# Patient Record
Sex: Female | Born: 1950
Health system: Southern US, Community
[De-identification: ages and names within clinical notes are randomized; demographics above are authoritative.]

---

## 1998-10-12 ENCOUNTER — Ambulatory Visit (HOSPITAL_COMMUNITY): Admission: RE | Admit: 1998-10-12 | Discharge: 1998-10-12 | Payer: Self-pay | Admitting: Obstetrics & Gynecology

## 1999-10-12 ENCOUNTER — Encounter: Payer: Self-pay | Admitting: Obstetrics & Gynecology

## 1999-10-12 ENCOUNTER — Ambulatory Visit (HOSPITAL_COMMUNITY): Admission: RE | Admit: 1999-10-12 | Discharge: 1999-10-12 | Payer: Self-pay | Admitting: Obstetrics & Gynecology

## 2000-10-14 ENCOUNTER — Ambulatory Visit (HOSPITAL_COMMUNITY): Admission: RE | Admit: 2000-10-14 | Discharge: 2000-10-14 | Payer: Self-pay | Admitting: Obstetrics & Gynecology

## 2000-10-14 ENCOUNTER — Encounter: Payer: Self-pay | Admitting: Obstetrics & Gynecology

## 2000-10-18 ENCOUNTER — Encounter: Payer: Self-pay | Admitting: Obstetrics & Gynecology

## 2000-10-18 ENCOUNTER — Encounter: Admission: RE | Admit: 2000-10-18 | Discharge: 2000-10-18 | Payer: Self-pay | Admitting: Obstetrics & Gynecology

## 2000-10-30 ENCOUNTER — Other Ambulatory Visit: Admission: RE | Admit: 2000-10-30 | Discharge: 2000-10-30 | Payer: Self-pay | Admitting: Obstetrics & Gynecology

## 2003-05-10 ENCOUNTER — Emergency Department (HOSPITAL_COMMUNITY): Admission: EM | Admit: 2003-05-10 | Discharge: 2003-05-10 | Payer: Self-pay | Admitting: Emergency Medicine

## 2003-06-23 ENCOUNTER — Other Ambulatory Visit: Admission: RE | Admit: 2003-06-23 | Discharge: 2003-06-23 | Payer: Self-pay | Admitting: Obstetrics & Gynecology

## 2004-06-26 ENCOUNTER — Other Ambulatory Visit: Admission: RE | Admit: 2004-06-26 | Discharge: 2004-06-26 | Payer: Self-pay | Admitting: Obstetrics & Gynecology

## 2005-07-20 ENCOUNTER — Other Ambulatory Visit: Admission: RE | Admit: 2005-07-20 | Discharge: 2005-07-20 | Payer: Self-pay | Admitting: Obstetrics and Gynecology

## 2010-09-25 ENCOUNTER — Ambulatory Visit: Payer: Self-pay | Admitting: Emergency Medicine

## 2010-09-25 DIAGNOSIS — M25569 Pain in unspecified knee: Secondary | ICD-10-CM | POA: Insufficient documentation

## 2010-10-02 ENCOUNTER — Telehealth (INDEPENDENT_AMBULATORY_CARE_PROVIDER_SITE_OTHER): Payer: Self-pay | Admitting: *Deleted

## 2011-01-21 ENCOUNTER — Encounter: Payer: Self-pay | Admitting: Obstetrics & Gynecology

## 2011-02-01 NOTE — Assessment & Plan Note (Signed)
Summary: KNEE PAIN /NH   Vital Signs:  Patient Profile:   60 Years Old Female CC:      left knee pain post fall last night Height:     63 inches Weight:      166 pounds O2 Sat:      97 % O2 treatment:    Room Air Temp:     98.8 degrees F oral Pulse rate:   90 / minute Resp:     14 per minute BP sitting:   131 / 86  (left arm) Cuff size:   regular  Pt. in pain?   yes    Location:   left knee    Intensity:   8    Type:       dull  Vitals Entered By: Lajean Saver RN (September 25, 2010 4:01 PM)                   Updated Prior Medication List: No Medications Current Allergies: ! SULFAHistory of Present Illness Chief Complaint: left knee pain post fall last night History of Present Illness: L knee pain s/p a fall last night.  Tripped over her own shoes when taking the dog out.  Larey Seat quickly, so doesn't recall exactly what happened, but thinks she landed on anterior knee.  Pain was worse last night and this morning and has since been getting better.  Sharp pain worse w/ movement located medial L knee.  Mild swelling. No prev knee trauma or injuries. Not taking any OTC meds.  REVIEW OF SYSTEMS Constitutional Symptoms      Denies fever, chills, night sweats, weight loss, weight gain, and fatigue.  Eyes       Denies change in vision, eye pain, eye discharge, glasses, contact lenses, and eye surgery. Ear/Nose/Throat/Mouth       Denies hearing loss/aids, change in hearing, ear pain, ear discharge, dizziness, frequent runny nose, frequent nose bleeds, sinus problems, sore throat, hoarseness, and tooth pain or bleeding.  Respiratory       Denies dry cough, productive cough, wheezing, shortness of breath, asthma, bronchitis, and emphysema/COPD.  Cardiovascular       Denies murmurs, chest pain, and tires easily with exhertion.    Gastrointestinal       Denies stomach pain, nausea/vomiting, diarrhea, constipation, blood in bowel movements, and indigestion. Genitourniary  Denies painful urination, blood or discharge from vagina, kidney stones, and loss of urinary control. Neurological       Denies paralysis, seizures, and fainting/blackouts. Musculoskeletal       Complains of joint pain and swelling.      Denies muscle pain, joint stiffness, decreased range of motion, redness, muscle weakness, and gout.      Comments: left knee Skin       Denies bruising, unusual mles/lumps or sores, and hair/skin or nail changes.  Psych       Denies mood changes, temper/anger issues, anxiety/stress, speech problems, depression, and sleep problems. Other Comments: Patient was letting her dog out last night and tripped over a shoe landing on her left knee.    Past History:  Past Medical History: Unremarkable  Past Surgical History: Hysterectomy 1988 Cholecystectomy 50  Family History: MI- father and mother  Social History: Never Smoked Alcohol use-yes 4/week Drug use-no Smoking Status:  never Drug Use:  no Physical Exam General appearance: well developed, well nourished, no acute distress Head: normocephalic, atraumatic Extremities: see below Neurological: grossly intact and non-focal Skin: no obvious rashes or lesions MSE:  oriented to time, place, and person Left knee: FROM, no effusion, no ecchymoses, Lachmans normal, Anterior & posterior drawer normal, McMurrays normal, valgus stress is painful and tender along the course of the MCL, medial joint line only slightly painful.  Patella freely mobile, Clarks compression test normal.  Good alignment.  Assessment New Problems: KNEE PAIN, LEFT (ICD-719.46)  Left MCL sprain Xray is normal  Plan New Medications/Changes: MELOXICAM 7.5 MG TABS (MELOXICAM) 1 tab by mouth two times a day as needed for pain  #40 x 0, 09/25/2010, Hoyt Koch MD  New Orders: New Patient Level III [phone] T-DG Knee Complete 4 Views*L* [27253] Planning Comments:   Ice, avoid further trauma, rest Take meds as directed You  should notice that you're getting better slowly over the next week.  If not, then follow up with your PCP or call sports medicine   The patient and/or caregiver has been counseled thoroughly with regard to medications prescribed including dosage, schedule, interactions, rationale for use, and possible side effects and they verbalize understanding.  Diagnoses and expected course of recovery discussed and will return if not improved as expected or if the condition worsens. Patient and/or caregiver verbalized understanding.  Prescriptions: MELOXICAM 7.5 MG TABS (MELOXICAM) 1 tab by mouth two times a day as needed for pain  #40 x 0   Entered and Authorized by:   Hoyt Koch MD   Signed by:   Hoyt Koch MD on 09/25/2010   Method used:   Print then Give to Patient   RxID:   [id number]   Orders Added: 1)  New Patient Level III [75643] 2)  T-DG Knee Complete 4 Views*L* [32951]

## 2011-02-01 NOTE — Progress Notes (Signed)
  Phone Note Outgoing Call Call back at [facility] Phone [phone]   Call placed by: Lajean Saver RN,  October 02, 2010 3:04 PM Call placed to: Patient Summary of Call: Call back: No answe.Message left wiht reason for call and call back with any questions

## 2011-09-04 IMAGING — CR DG KNEE COMPLETE 4+V*L*
4 series · 4 of 4 positions shown · non-contrast
Comparison: None.

CLINICAL DATA: Medial knee pain following fall next field

LEFT KNEE - COMPLETE 4+ VIEW

[view not recorded (1 of 4)]
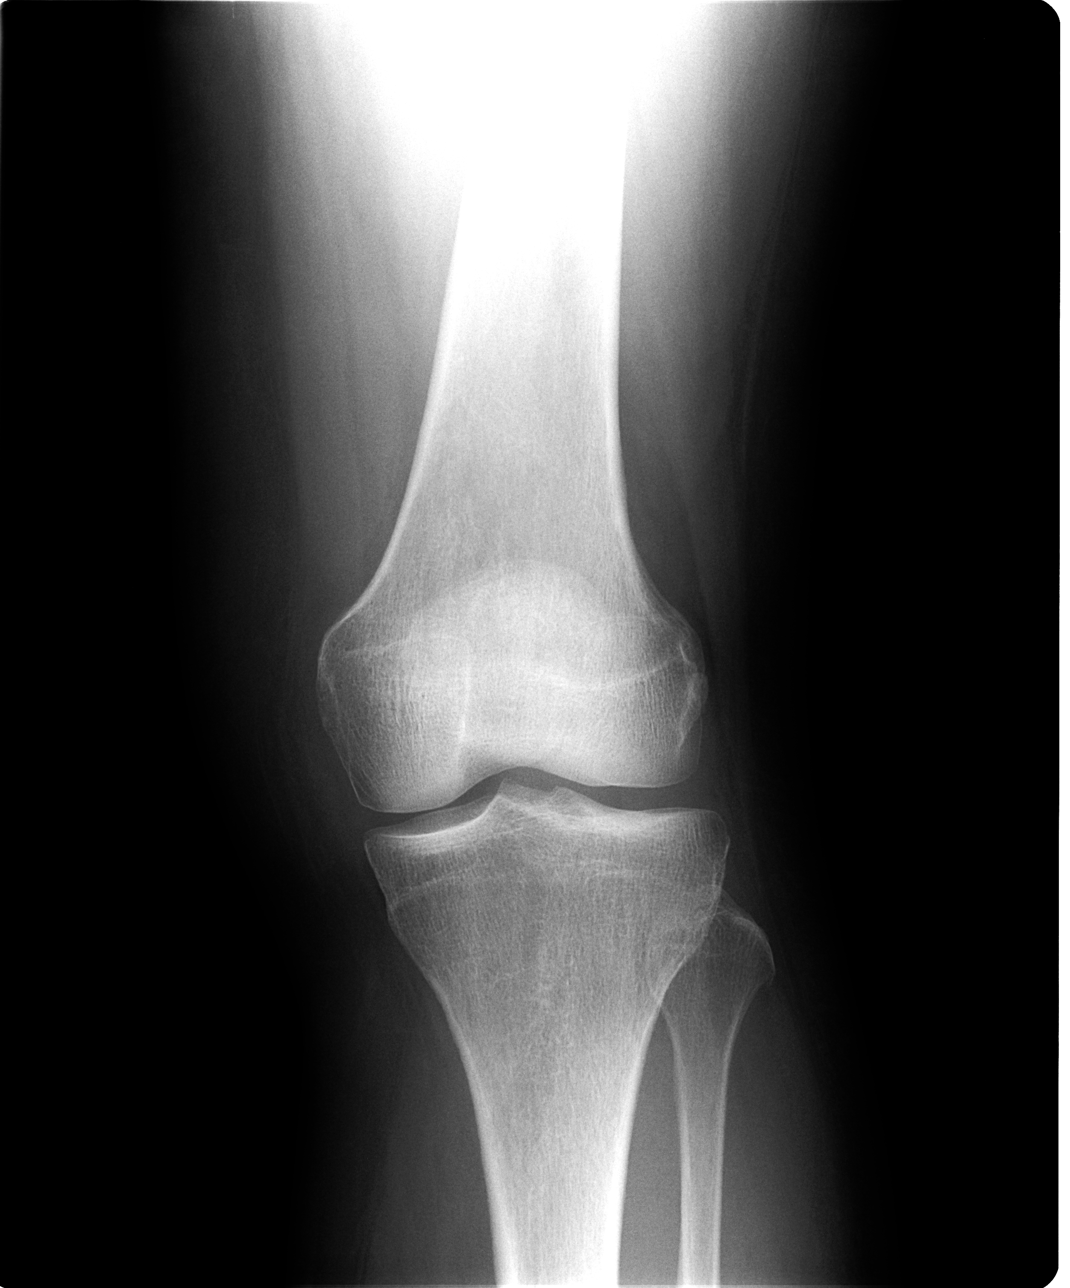

[view not recorded (2 of 4)]
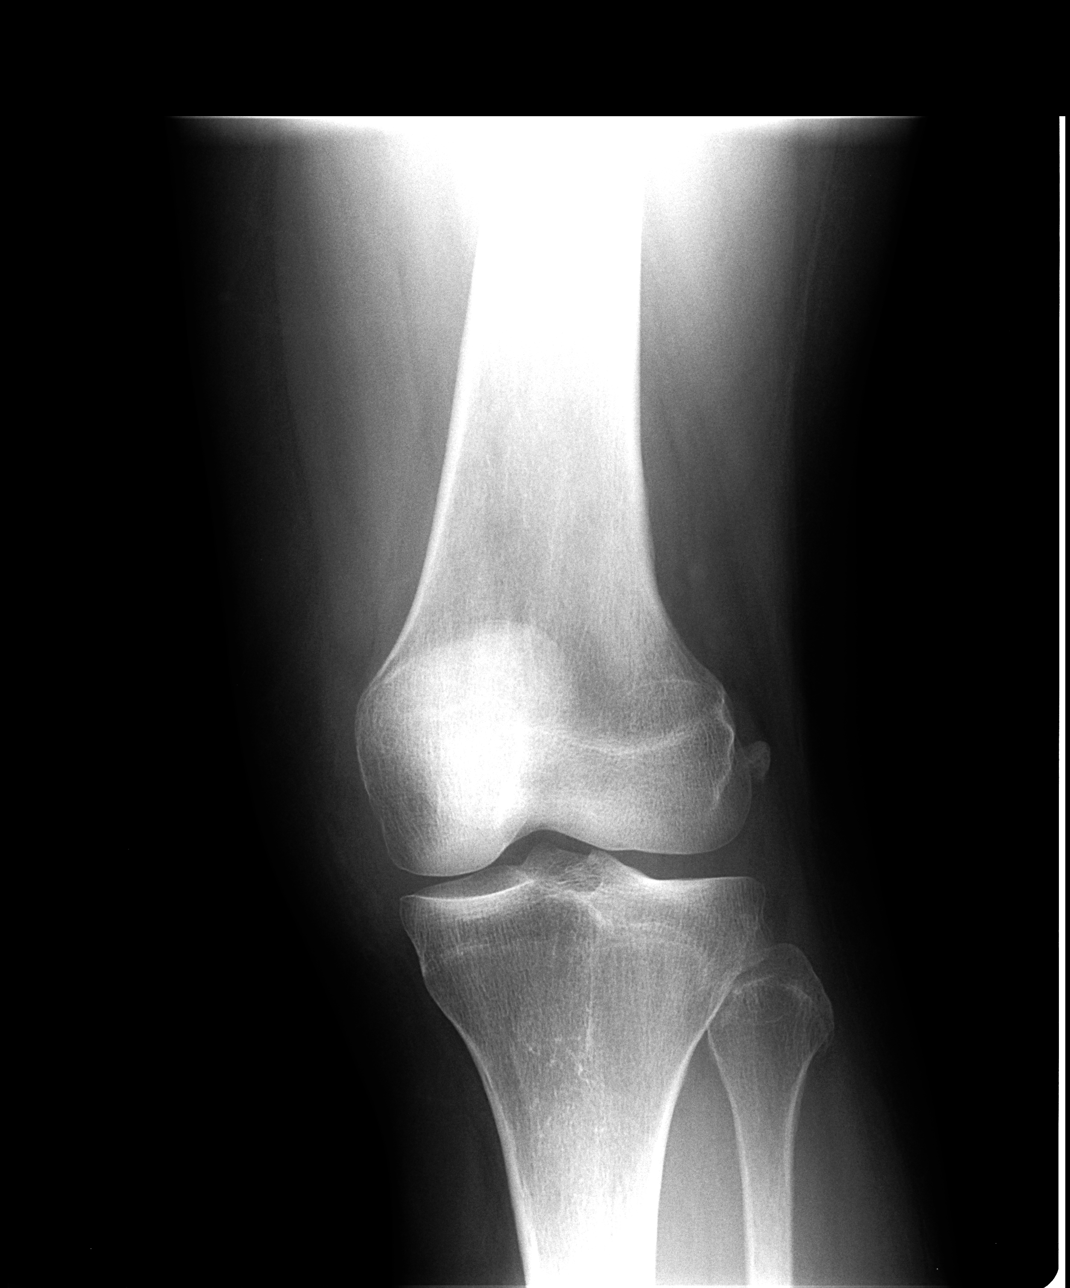

[view not recorded (3 of 4)]
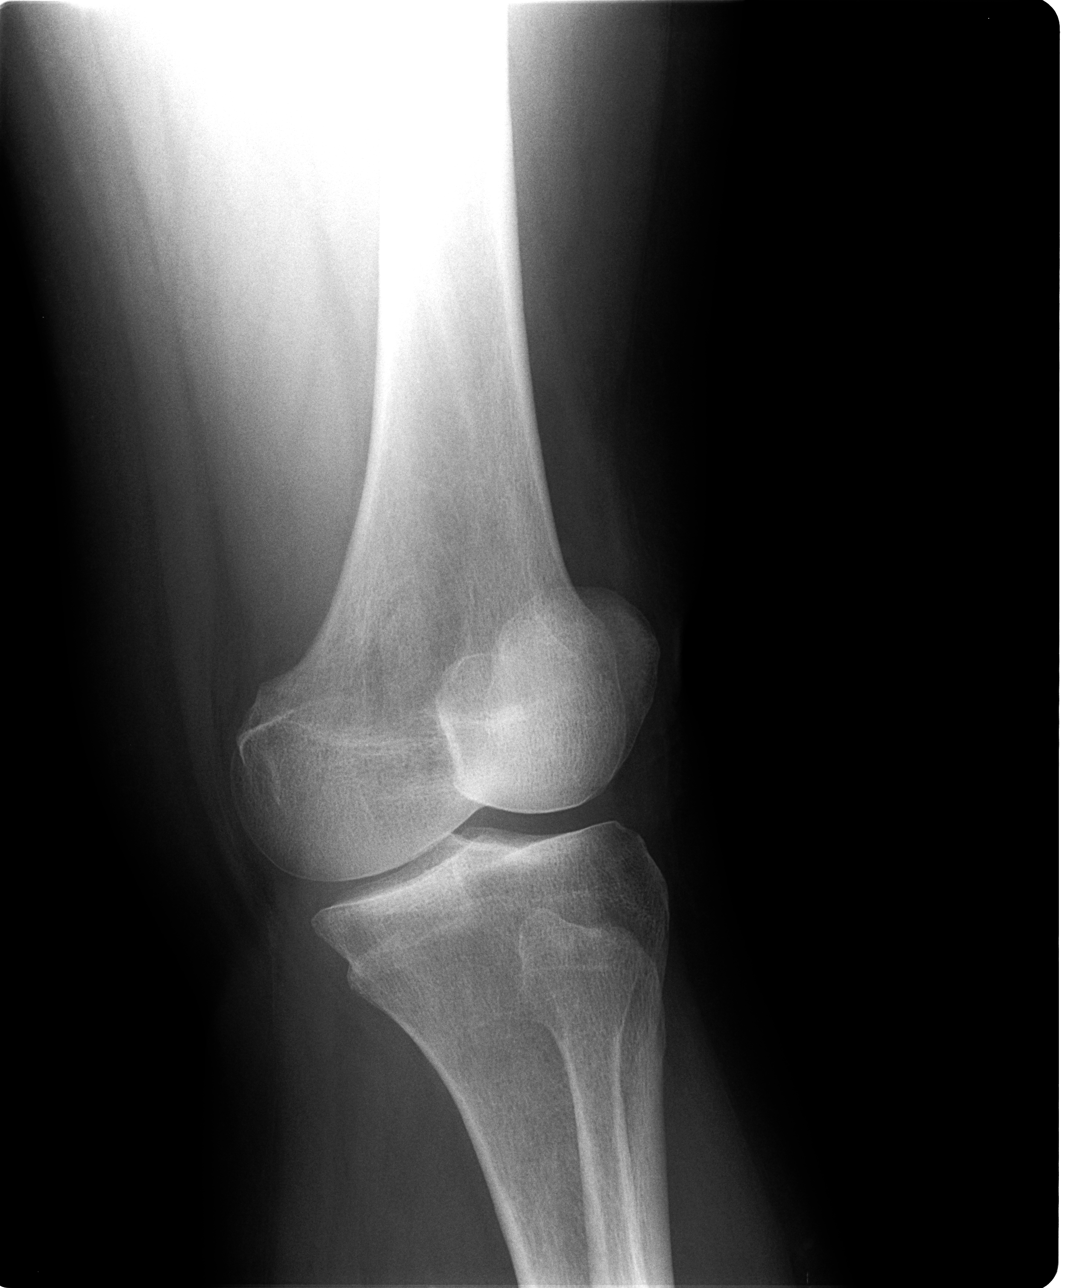

[view not recorded (4 of 4)]
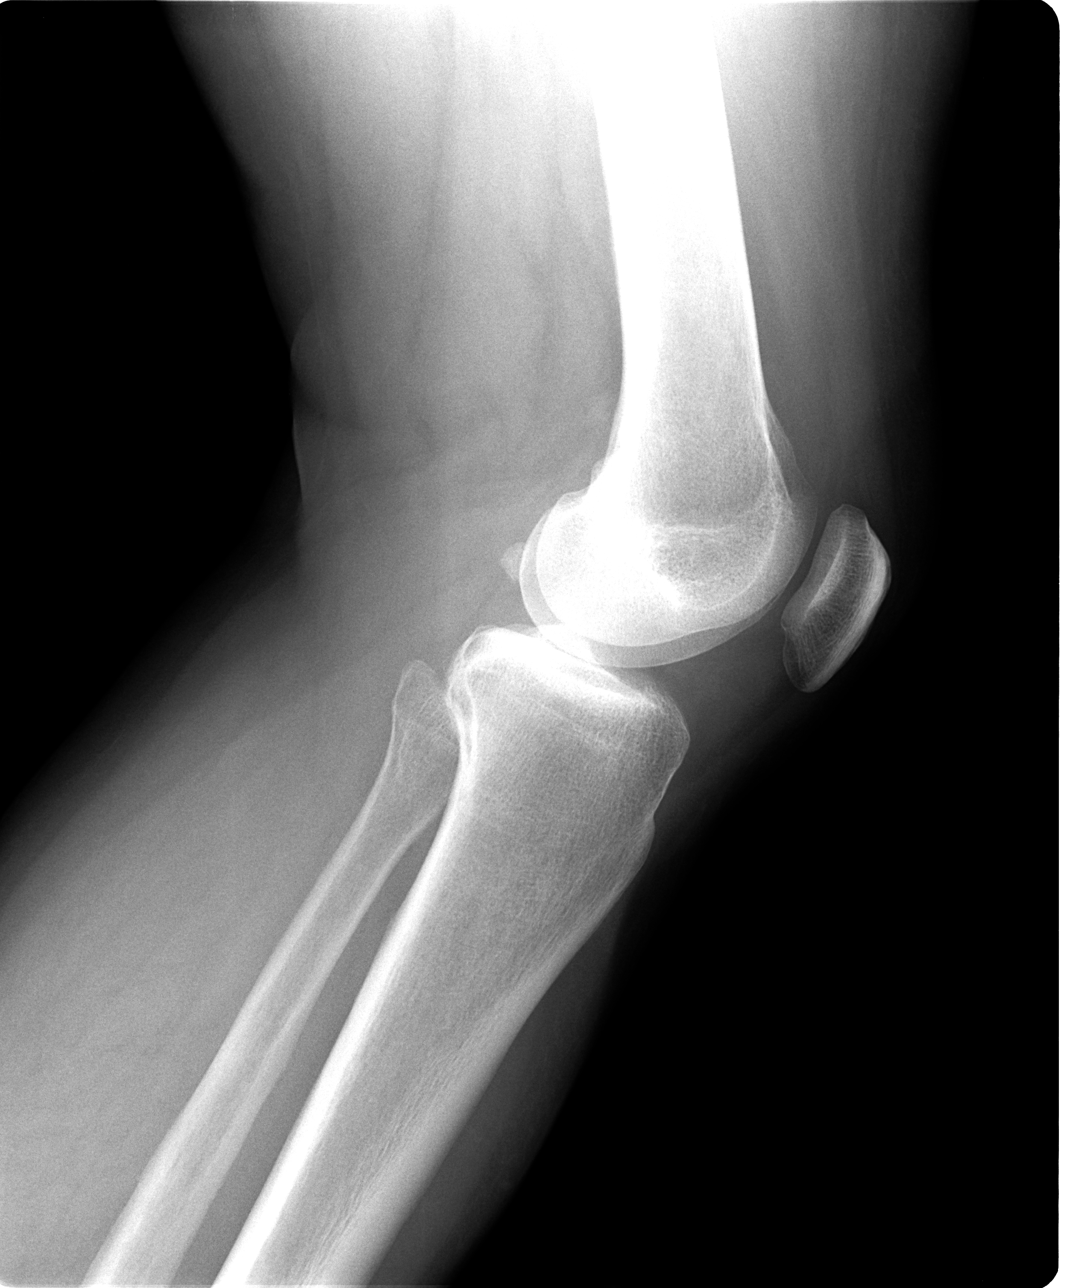

[4 of 4 positions shown; findings below may reference images not displayed]

FINDINGS: No fracture or dislocation.  No specific arthropathy. No
foreign body or other abnormality of the soft tissues.
IMPRESSION: No acute or significant findings.

## 2014-09-10 ENCOUNTER — Other Ambulatory Visit: Payer: Self-pay | Admitting: Obstetrics & Gynecology

## 2014-09-10 DIAGNOSIS — R928 Other abnormal and inconclusive findings on diagnostic imaging of breast: Secondary | ICD-10-CM

## 2014-09-22 ENCOUNTER — Inpatient Hospital Stay: Admission: RE | Admit: 2014-09-22 | Payer: Self-pay | Source: Ambulatory Visit

## 2017-09-06 DIAGNOSIS — M79641 Pain in right hand: Secondary | ICD-10-CM | POA: Diagnosis not present

## 2018-07-17 DIAGNOSIS — H60543 Acute eczematoid otitis externa, bilateral: Secondary | ICD-10-CM | POA: Diagnosis not present

## 2018-07-17 DIAGNOSIS — L309 Dermatitis, unspecified: Secondary | ICD-10-CM | POA: Diagnosis not present

## 2018-09-12 DIAGNOSIS — R030 Elevated blood-pressure reading, without diagnosis of hypertension: Secondary | ICD-10-CM | POA: Diagnosis not present

## 2018-09-12 DIAGNOSIS — Z6830 Body mass index (BMI) 30.0-30.9, adult: Secondary | ICD-10-CM | POA: Diagnosis not present

## 2018-09-12 DIAGNOSIS — Z823 Family history of stroke: Secondary | ICD-10-CM | POA: Diagnosis not present

## 2018-09-12 DIAGNOSIS — Z8249 Family history of ischemic heart disease and other diseases of the circulatory system: Secondary | ICD-10-CM | POA: Diagnosis not present

## 2018-09-12 DIAGNOSIS — E669 Obesity, unspecified: Secondary | ICD-10-CM | POA: Diagnosis not present

## 2018-09-12 DIAGNOSIS — I951 Orthostatic hypotension: Secondary | ICD-10-CM | POA: Diagnosis not present

## 2018-09-12 DIAGNOSIS — Z882 Allergy status to sulfonamides status: Secondary | ICD-10-CM | POA: Diagnosis not present

## 2018-09-12 DIAGNOSIS — Z853 Personal history of malignant neoplasm of breast: Secondary | ICD-10-CM | POA: Diagnosis not present

## 2018-10-28 DIAGNOSIS — Z1231 Encounter for screening mammogram for malignant neoplasm of breast: Secondary | ICD-10-CM | POA: Diagnosis not present

## 2018-10-29 DIAGNOSIS — H527 Unspecified disorder of refraction: Secondary | ICD-10-CM | POA: Diagnosis not present

## 2018-10-29 DIAGNOSIS — H2513 Age-related nuclear cataract, bilateral: Secondary | ICD-10-CM | POA: Diagnosis not present

## 2018-10-29 DIAGNOSIS — H53039 Strabismic amblyopia, unspecified eye: Secondary | ICD-10-CM | POA: Diagnosis not present

## 2018-11-12 DIAGNOSIS — H5015 Alternating exotropia: Secondary | ICD-10-CM | POA: Diagnosis not present

## 2019-06-30 DIAGNOSIS — S39011A Strain of muscle, fascia and tendon of abdomen, initial encounter: Secondary | ICD-10-CM | POA: Diagnosis not present

## 2019-09-30 DIAGNOSIS — Z20828 Contact with and (suspected) exposure to other viral communicable diseases: Secondary | ICD-10-CM | POA: Diagnosis not present

## 2019-09-30 DIAGNOSIS — J029 Acute pharyngitis, unspecified: Secondary | ICD-10-CM | POA: Diagnosis not present

## 2019-10-05 DIAGNOSIS — Z20828 Contact with and (suspected) exposure to other viral communicable diseases: Secondary | ICD-10-CM | POA: Diagnosis not present

## 2019-10-05 DIAGNOSIS — Z03818 Encounter for observation for suspected exposure to other biological agents ruled out: Secondary | ICD-10-CM | POA: Diagnosis not present

## 2019-12-29 DIAGNOSIS — D122 Benign neoplasm of ascending colon: Secondary | ICD-10-CM | POA: Diagnosis not present

## 2019-12-29 DIAGNOSIS — D120 Benign neoplasm of cecum: Secondary | ICD-10-CM | POA: Diagnosis not present

## 2019-12-29 DIAGNOSIS — K635 Polyp of colon: Secondary | ICD-10-CM | POA: Diagnosis not present

## 2019-12-29 DIAGNOSIS — D123 Benign neoplasm of transverse colon: Secondary | ICD-10-CM | POA: Diagnosis not present

## 2019-12-29 DIAGNOSIS — Z86010 Personal history of colonic polyps: Secondary | ICD-10-CM | POA: Diagnosis not present

## 2019-12-30 DIAGNOSIS — Z1231 Encounter for screening mammogram for malignant neoplasm of breast: Secondary | ICD-10-CM | POA: Diagnosis not present

## 2019-12-30 DIAGNOSIS — Z6830 Body mass index (BMI) 30.0-30.9, adult: Secondary | ICD-10-CM | POA: Diagnosis not present

## 2019-12-30 DIAGNOSIS — Z1272 Encounter for screening for malignant neoplasm of vagina: Secondary | ICD-10-CM | POA: Diagnosis not present

## 2019-12-30 DIAGNOSIS — Z124 Encounter for screening for malignant neoplasm of cervix: Secondary | ICD-10-CM | POA: Diagnosis not present

## 2019-12-30 DIAGNOSIS — Z90710 Acquired absence of both cervix and uterus: Secondary | ICD-10-CM | POA: Diagnosis not present

## 2020-05-12 DIAGNOSIS — Z809 Family history of malignant neoplasm, unspecified: Secondary | ICD-10-CM | POA: Diagnosis not present

## 2020-05-12 DIAGNOSIS — Z853 Personal history of malignant neoplasm of breast: Secondary | ICD-10-CM | POA: Diagnosis not present

## 2020-05-12 DIAGNOSIS — Z6831 Body mass index (BMI) 31.0-31.9, adult: Secondary | ICD-10-CM | POA: Diagnosis not present

## 2020-05-12 DIAGNOSIS — Z833 Family history of diabetes mellitus: Secondary | ICD-10-CM | POA: Diagnosis not present

## 2020-05-12 DIAGNOSIS — Z882 Allergy status to sulfonamides status: Secondary | ICD-10-CM | POA: Diagnosis not present

## 2020-05-12 DIAGNOSIS — R030 Elevated blood-pressure reading, without diagnosis of hypertension: Secondary | ICD-10-CM | POA: Diagnosis not present

## 2020-05-12 DIAGNOSIS — E669 Obesity, unspecified: Secondary | ICD-10-CM | POA: Diagnosis not present

## 2020-05-27 DIAGNOSIS — S93401A Sprain of unspecified ligament of right ankle, initial encounter: Secondary | ICD-10-CM | POA: Diagnosis not present

## 2020-05-27 DIAGNOSIS — S92352A Displaced fracture of fifth metatarsal bone, left foot, initial encounter for closed fracture: Secondary | ICD-10-CM | POA: Diagnosis not present

## 2020-05-28 DIAGNOSIS — M79671 Pain in right foot: Secondary | ICD-10-CM | POA: Diagnosis not present

## 2020-06-14 DIAGNOSIS — M79671 Pain in right foot: Secondary | ICD-10-CM | POA: Diagnosis not present

## 2020-07-05 DIAGNOSIS — M79671 Pain in right foot: Secondary | ICD-10-CM | POA: Diagnosis not present

## 2020-07-07 DIAGNOSIS — R609 Edema, unspecified: Secondary | ICD-10-CM | POA: Diagnosis not present

## 2020-07-07 DIAGNOSIS — R2241 Localized swelling, mass and lump, right lower limb: Secondary | ICD-10-CM | POA: Diagnosis not present

## 2020-07-13 DIAGNOSIS — M79671 Pain in right foot: Secondary | ICD-10-CM | POA: Diagnosis not present

## 2020-08-23 DIAGNOSIS — M79671 Pain in right foot: Secondary | ICD-10-CM | POA: Diagnosis not present

## 2021-01-12 DIAGNOSIS — H53039 Strabismic amblyopia, unspecified eye: Secondary | ICD-10-CM | POA: Diagnosis not present

## 2021-01-12 DIAGNOSIS — H2513 Age-related nuclear cataract, bilateral: Secondary | ICD-10-CM | POA: Diagnosis not present

## 2021-01-12 DIAGNOSIS — H5203 Hypermetropia, bilateral: Secondary | ICD-10-CM | POA: Diagnosis not present

## 2021-01-12 DIAGNOSIS — H40033 Anatomical narrow angle, bilateral: Secondary | ICD-10-CM | POA: Diagnosis not present

## 2021-01-26 DIAGNOSIS — Z780 Asymptomatic menopausal state: Secondary | ICD-10-CM | POA: Diagnosis not present

## 2021-01-26 DIAGNOSIS — Z1231 Encounter for screening mammogram for malignant neoplasm of breast: Secondary | ICD-10-CM | POA: Diagnosis not present

## 2021-01-26 DIAGNOSIS — Z13820 Encounter for screening for osteoporosis: Secondary | ICD-10-CM | POA: Diagnosis not present

## 2021-01-26 DIAGNOSIS — M85852 Other specified disorders of bone density and structure, left thigh: Secondary | ICD-10-CM | POA: Diagnosis not present

## 2021-02-16 DIAGNOSIS — Z136 Encounter for screening for cardiovascular disorders: Secondary | ICD-10-CM | POA: Diagnosis not present

## 2021-02-16 DIAGNOSIS — Z23 Encounter for immunization: Secondary | ICD-10-CM | POA: Diagnosis not present

## 2021-02-16 DIAGNOSIS — I5189 Other ill-defined heart diseases: Secondary | ICD-10-CM | POA: Diagnosis not present

## 2021-02-16 DIAGNOSIS — L309 Dermatitis, unspecified: Secondary | ICD-10-CM | POA: Diagnosis not present

## 2021-02-16 DIAGNOSIS — Z0000 Encounter for general adult medical examination without abnormal findings: Secondary | ICD-10-CM | POA: Diagnosis not present

## 2021-02-20 DIAGNOSIS — H53041 Amblyopia suspect, right eye: Secondary | ICD-10-CM | POA: Diagnosis not present

## 2021-02-20 DIAGNOSIS — H40033 Anatomical narrow angle, bilateral: Secondary | ICD-10-CM | POA: Diagnosis not present

## 2021-02-20 DIAGNOSIS — H04123 Dry eye syndrome of bilateral lacrimal glands: Secondary | ICD-10-CM | POA: Diagnosis not present

## 2021-02-20 DIAGNOSIS — H53042 Amblyopia suspect, left eye: Secondary | ICD-10-CM | POA: Diagnosis not present

## 2021-02-20 DIAGNOSIS — H2513 Age-related nuclear cataract, bilateral: Secondary | ICD-10-CM | POA: Diagnosis not present

## 2021-02-28 DIAGNOSIS — R922 Inconclusive mammogram: Secondary | ICD-10-CM | POA: Diagnosis not present

## 2021-02-28 DIAGNOSIS — Z853 Personal history of malignant neoplasm of breast: Secondary | ICD-10-CM | POA: Diagnosis not present

## 2021-02-28 DIAGNOSIS — R921 Mammographic calcification found on diagnostic imaging of breast: Secondary | ICD-10-CM | POA: Diagnosis not present

## 2021-03-21 DIAGNOSIS — H40031 Anatomical narrow angle, right eye: Secondary | ICD-10-CM | POA: Diagnosis not present

## 2021-04-04 DIAGNOSIS — H40032 Anatomical narrow angle, left eye: Secondary | ICD-10-CM | POA: Diagnosis not present

## 2021-06-15 DIAGNOSIS — Z823 Family history of stroke: Secondary | ICD-10-CM | POA: Diagnosis not present

## 2021-06-15 DIAGNOSIS — Z853 Personal history of malignant neoplasm of breast: Secondary | ICD-10-CM | POA: Diagnosis not present

## 2021-06-15 DIAGNOSIS — R030 Elevated blood-pressure reading, without diagnosis of hypertension: Secondary | ICD-10-CM | POA: Diagnosis not present

## 2021-06-15 DIAGNOSIS — Z8249 Family history of ischemic heart disease and other diseases of the circulatory system: Secondary | ICD-10-CM | POA: Diagnosis not present

## 2021-06-15 DIAGNOSIS — I951 Orthostatic hypotension: Secondary | ICD-10-CM | POA: Diagnosis not present

## 2021-06-15 DIAGNOSIS — E785 Hyperlipidemia, unspecified: Secondary | ICD-10-CM | POA: Diagnosis not present

## 2021-06-15 DIAGNOSIS — Z6830 Body mass index (BMI) 30.0-30.9, adult: Secondary | ICD-10-CM | POA: Diagnosis not present

## 2021-06-15 DIAGNOSIS — E669 Obesity, unspecified: Secondary | ICD-10-CM | POA: Diagnosis not present

## 2021-06-15 DIAGNOSIS — Z882 Allergy status to sulfonamides status: Secondary | ICD-10-CM | POA: Diagnosis not present

## 2021-06-15 DIAGNOSIS — R609 Edema, unspecified: Secondary | ICD-10-CM | POA: Diagnosis not present

## 2021-09-14 DIAGNOSIS — R922 Inconclusive mammogram: Secondary | ICD-10-CM | POA: Diagnosis not present

## 2021-09-14 DIAGNOSIS — R921 Mammographic calcification found on diagnostic imaging of breast: Secondary | ICD-10-CM | POA: Diagnosis not present

## 2021-11-13 DIAGNOSIS — R030 Elevated blood-pressure reading, without diagnosis of hypertension: Secondary | ICD-10-CM | POA: Diagnosis not present

## 2021-11-13 DIAGNOSIS — H60391 Other infective otitis externa, right ear: Secondary | ICD-10-CM | POA: Diagnosis not present

## 2021-12-28 DIAGNOSIS — Z20822 Contact with and (suspected) exposure to covid-19: Secondary | ICD-10-CM | POA: Diagnosis not present

## 2021-12-28 DIAGNOSIS — U071 COVID-19: Secondary | ICD-10-CM | POA: Diagnosis not present

## 2022-01-16 DIAGNOSIS — R059 Cough, unspecified: Secondary | ICD-10-CM | POA: Diagnosis not present

## 2022-02-27 DIAGNOSIS — M25559 Pain in unspecified hip: Secondary | ICD-10-CM | POA: Diagnosis not present

## 2022-02-27 DIAGNOSIS — M1611 Unilateral primary osteoarthritis, right hip: Secondary | ICD-10-CM | POA: Diagnosis not present

## 2022-02-27 DIAGNOSIS — M25551 Pain in right hip: Secondary | ICD-10-CM | POA: Diagnosis not present

## 2022-03-16 DIAGNOSIS — M1611 Unilateral primary osteoarthritis, right hip: Secondary | ICD-10-CM | POA: Diagnosis not present

## 2022-03-20 DIAGNOSIS — R921 Mammographic calcification found on diagnostic imaging of breast: Secondary | ICD-10-CM | POA: Diagnosis not present

## 2022-03-20 DIAGNOSIS — R922 Inconclusive mammogram: Secondary | ICD-10-CM | POA: Diagnosis not present

## 2022-04-12 DIAGNOSIS — R609 Edema, unspecified: Secondary | ICD-10-CM | POA: Diagnosis not present

## 2022-04-12 DIAGNOSIS — E785 Hyperlipidemia, unspecified: Secondary | ICD-10-CM | POA: Diagnosis not present

## 2022-04-12 DIAGNOSIS — R030 Elevated blood-pressure reading, without diagnosis of hypertension: Secondary | ICD-10-CM | POA: Diagnosis not present

## 2022-04-12 DIAGNOSIS — Z6830 Body mass index (BMI) 30.0-30.9, adult: Secondary | ICD-10-CM | POA: Diagnosis not present

## 2022-04-12 DIAGNOSIS — E669 Obesity, unspecified: Secondary | ICD-10-CM | POA: Diagnosis not present

## 2022-04-12 DIAGNOSIS — Z882 Allergy status to sulfonamides status: Secondary | ICD-10-CM | POA: Diagnosis not present

## 2022-04-12 DIAGNOSIS — Z008 Encounter for other general examination: Secondary | ICD-10-CM | POA: Diagnosis not present

## 2022-06-27 DIAGNOSIS — H40033 Anatomical narrow angle, bilateral: Secondary | ICD-10-CM | POA: Diagnosis not present

## 2022-06-27 DIAGNOSIS — H2513 Age-related nuclear cataract, bilateral: Secondary | ICD-10-CM | POA: Diagnosis not present

## 2022-06-27 DIAGNOSIS — H5203 Hypermetropia, bilateral: Secondary | ICD-10-CM | POA: Diagnosis not present

## 2022-06-27 DIAGNOSIS — H53039 Strabismic amblyopia, unspecified eye: Secondary | ICD-10-CM | POA: Diagnosis not present

## 2022-08-18 DIAGNOSIS — R519 Headache, unspecified: Secondary | ICD-10-CM | POA: Diagnosis not present

## 2022-08-18 DIAGNOSIS — R9431 Abnormal electrocardiogram [ECG] [EKG]: Secondary | ICD-10-CM | POA: Diagnosis not present

## 2022-08-18 DIAGNOSIS — G459 Transient cerebral ischemic attack, unspecified: Secondary | ICD-10-CM | POA: Diagnosis not present

## 2022-08-18 DIAGNOSIS — I720 Aneurysm of carotid artery: Secondary | ICD-10-CM | POA: Diagnosis not present

## 2022-08-18 DIAGNOSIS — R9082 White matter disease, unspecified: Secondary | ICD-10-CM | POA: Diagnosis not present

## 2022-08-18 DIAGNOSIS — Z86010 Personal history of colonic polyps: Secondary | ICD-10-CM | POA: Diagnosis not present

## 2022-08-18 DIAGNOSIS — R42 Dizziness and giddiness: Secondary | ICD-10-CM | POA: Diagnosis not present

## 2022-08-18 DIAGNOSIS — Z882 Allergy status to sulfonamides status: Secondary | ICD-10-CM | POA: Diagnosis not present

## 2022-08-18 DIAGNOSIS — Z79899 Other long term (current) drug therapy: Secondary | ICD-10-CM | POA: Diagnosis not present

## 2022-08-18 DIAGNOSIS — R4781 Slurred speech: Secondary | ICD-10-CM | POA: Diagnosis not present

## 2022-08-18 DIAGNOSIS — Z853 Personal history of malignant neoplasm of breast: Secondary | ICD-10-CM | POA: Diagnosis not present

## 2022-08-22 DIAGNOSIS — G459 Transient cerebral ischemic attack, unspecified: Secondary | ICD-10-CM | POA: Diagnosis not present

## 2022-08-22 DIAGNOSIS — I517 Cardiomegaly: Secondary | ICD-10-CM | POA: Diagnosis not present

## 2022-08-22 DIAGNOSIS — I671 Cerebral aneurysm, nonruptured: Secondary | ICD-10-CM | POA: Diagnosis not present

## 2022-08-22 DIAGNOSIS — I10 Essential (primary) hypertension: Secondary | ICD-10-CM | POA: Diagnosis not present

## 2022-08-22 DIAGNOSIS — E785 Hyperlipidemia, unspecified: Secondary | ICD-10-CM | POA: Diagnosis not present

## 2022-08-24 DIAGNOSIS — G459 Transient cerebral ischemic attack, unspecified: Secondary | ICD-10-CM | POA: Diagnosis not present

## 2022-08-24 DIAGNOSIS — Z7185 Encounter for immunization safety counseling: Secondary | ICD-10-CM | POA: Diagnosis not present

## 2022-08-24 DIAGNOSIS — E663 Overweight: Secondary | ICD-10-CM | POA: Diagnosis not present

## 2022-08-24 DIAGNOSIS — D126 Benign neoplasm of colon, unspecified: Secondary | ICD-10-CM | POA: Diagnosis not present

## 2022-08-24 DIAGNOSIS — Z6829 Body mass index (BMI) 29.0-29.9, adult: Secondary | ICD-10-CM | POA: Diagnosis not present

## 2022-08-24 DIAGNOSIS — I671 Cerebral aneurysm, nonruptured: Secondary | ICD-10-CM | POA: Diagnosis not present

## 2022-08-29 DIAGNOSIS — I671 Cerebral aneurysm, nonruptured: Secondary | ICD-10-CM | POA: Diagnosis not present

## 2022-09-26 DIAGNOSIS — G459 Transient cerebral ischemic attack, unspecified: Secondary | ICD-10-CM | POA: Diagnosis not present

## 2022-09-26 DIAGNOSIS — I517 Cardiomegaly: Secondary | ICD-10-CM | POA: Diagnosis not present

## 2022-10-01 DIAGNOSIS — H60541 Acute eczematoid otitis externa, right ear: Secondary | ICD-10-CM | POA: Diagnosis not present

## 2022-10-09 DIAGNOSIS — Z0100 Encounter for examination of eyes and vision without abnormal findings: Secondary | ICD-10-CM | POA: Diagnosis not present

## 2023-01-03 DIAGNOSIS — D123 Benign neoplasm of transverse colon: Secondary | ICD-10-CM | POA: Diagnosis not present

## 2023-01-03 DIAGNOSIS — Z86010 Personal history of colonic polyps: Secondary | ICD-10-CM | POA: Diagnosis not present

## 2023-01-03 DIAGNOSIS — K5730 Diverticulosis of large intestine without perforation or abscess without bleeding: Secondary | ICD-10-CM | POA: Diagnosis not present

## 2023-01-03 DIAGNOSIS — Z09 Encounter for follow-up examination after completed treatment for conditions other than malignant neoplasm: Secondary | ICD-10-CM | POA: Diagnosis not present

## 2023-04-03 DIAGNOSIS — M1611 Unilateral primary osteoarthritis, right hip: Secondary | ICD-10-CM | POA: Diagnosis not present

## 2023-04-30 DIAGNOSIS — Z882 Allergy status to sulfonamides status: Secondary | ICD-10-CM | POA: Diagnosis not present

## 2023-04-30 DIAGNOSIS — I720 Aneurysm of carotid artery: Secondary | ICD-10-CM | POA: Diagnosis not present

## 2023-04-30 DIAGNOSIS — Z823 Family history of stroke: Secondary | ICD-10-CM | POA: Diagnosis not present

## 2023-04-30 DIAGNOSIS — E785 Hyperlipidemia, unspecified: Secondary | ICD-10-CM | POA: Diagnosis not present

## 2023-04-30 DIAGNOSIS — I10 Essential (primary) hypertension: Secondary | ICD-10-CM | POA: Diagnosis not present

## 2023-04-30 DIAGNOSIS — M1990 Unspecified osteoarthritis, unspecified site: Secondary | ICD-10-CM | POA: Diagnosis not present

## 2023-04-30 DIAGNOSIS — Z8249 Family history of ischemic heart disease and other diseases of the circulatory system: Secondary | ICD-10-CM | POA: Diagnosis not present

## 2023-04-30 DIAGNOSIS — M8580 Other specified disorders of bone density and structure, unspecified site: Secondary | ICD-10-CM | POA: Diagnosis not present

## 2023-04-30 DIAGNOSIS — H269 Unspecified cataract: Secondary | ICD-10-CM | POA: Diagnosis not present

## 2023-04-30 DIAGNOSIS — Z7982 Long term (current) use of aspirin: Secondary | ICD-10-CM | POA: Diagnosis not present

## 2023-04-30 DIAGNOSIS — Z853 Personal history of malignant neoplasm of breast: Secondary | ICD-10-CM | POA: Diagnosis not present

## 2023-07-08 DIAGNOSIS — H53039 Strabismic amblyopia, unspecified eye: Secondary | ICD-10-CM | POA: Diagnosis not present

## 2023-07-08 DIAGNOSIS — H2513 Age-related nuclear cataract, bilateral: Secondary | ICD-10-CM | POA: Diagnosis not present

## 2023-07-08 DIAGNOSIS — H5203 Hypermetropia, bilateral: Secondary | ICD-10-CM | POA: Diagnosis not present

## 2023-07-08 DIAGNOSIS — H40033 Anatomical narrow angle, bilateral: Secondary | ICD-10-CM | POA: Diagnosis not present

## 2023-09-05 DIAGNOSIS — H53039 Strabismic amblyopia, unspecified eye: Secondary | ICD-10-CM | POA: Diagnosis not present

## 2023-09-05 DIAGNOSIS — H5203 Hypermetropia, bilateral: Secondary | ICD-10-CM | POA: Diagnosis not present

## 2023-09-05 DIAGNOSIS — H25811 Combined forms of age-related cataract, right eye: Secondary | ICD-10-CM | POA: Diagnosis not present

## 2023-09-05 DIAGNOSIS — H2512 Age-related nuclear cataract, left eye: Secondary | ICD-10-CM | POA: Diagnosis not present

## 2023-09-05 DIAGNOSIS — H40033 Anatomical narrow angle, bilateral: Secondary | ICD-10-CM | POA: Diagnosis not present

## 2023-10-08 DIAGNOSIS — H903 Sensorineural hearing loss, bilateral: Secondary | ICD-10-CM | POA: Diagnosis not present

## 2023-10-08 DIAGNOSIS — H60543 Acute eczematoid otitis externa, bilateral: Secondary | ICD-10-CM | POA: Diagnosis not present

## 2023-11-19 DIAGNOSIS — H50331 Intermittent monocular exotropia, right eye: Secondary | ICD-10-CM | POA: Diagnosis not present

## 2023-11-19 DIAGNOSIS — H2513 Age-related nuclear cataract, bilateral: Secondary | ICD-10-CM | POA: Diagnosis not present

## 2024-01-03 DIAGNOSIS — H50111 Monocular exotropia, right eye: Secondary | ICD-10-CM | POA: Diagnosis not present

## 2024-01-03 DIAGNOSIS — H50331 Intermittent monocular exotropia, right eye: Secondary | ICD-10-CM | POA: Diagnosis not present

## 2024-07-14 DIAGNOSIS — H15111 Episcleritis periodica fugax, right eye: Secondary | ICD-10-CM | POA: Diagnosis not present

## 2024-11-16 DIAGNOSIS — R92323 Mammographic fibroglandular density, bilateral breasts: Secondary | ICD-10-CM | POA: Diagnosis not present

## 2024-11-16 DIAGNOSIS — Z1231 Encounter for screening mammogram for malignant neoplasm of breast: Secondary | ICD-10-CM | POA: Diagnosis not present
